# Patient Record
Sex: Female | Born: 2000 | Race: White | Hispanic: No | Marital: Single | Smoking: Never smoker
Health system: Southern US, Community
[De-identification: ages and names within clinical notes are randomized; demographics above are authoritative.]

## PROBLEM LIST (undated history)

## (undated) DIAGNOSIS — K5900 Constipation, unspecified: Secondary | ICD-10-CM

## (undated) DIAGNOSIS — S060XAA Concussion with loss of consciousness status unknown, initial encounter: Secondary | ICD-10-CM

## (undated) DIAGNOSIS — T753XXA Motion sickness, initial encounter: Secondary | ICD-10-CM

## (undated) DIAGNOSIS — J45909 Unspecified asthma, uncomplicated: Secondary | ICD-10-CM

## (undated) DIAGNOSIS — K5904 Chronic idiopathic constipation: Secondary | ICD-10-CM

## (undated) HISTORY — DX: Concussion with loss of consciousness status unknown, initial encounter: S06.0XAA

## (undated) HISTORY — DX: Motion sickness, initial encounter: T75.3XXA

## (undated) HISTORY — DX: Unspecified asthma, uncomplicated: J45.909

## (undated) HISTORY — DX: Constipation, unspecified: K59.00

## (undated) HISTORY — DX: Chronic idiopathic constipation: K59.04

---
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
## 2021-01-03 ENCOUNTER — Telehealth: Payer: Self-pay | Admitting: School

## 2021-01-03 NOTE — Telephone Encounter (Signed)
Patient s/p IUD removal on 12/31/20. States she has been having heavy vaginal bleeding and cramping the past couple of days, changing pad 2 times a day.  Informed she most likely got her period d/t sudden hormone changes.   Instructed to monitor and call if cramping becomes severe or bleeding excessive.  Recommended ibuprofen 600 mg every 6 hours.  Patient agreeable.

## 2021-01-18 ENCOUNTER — Ambulatory Visit: Payer: PRIVATE HEALTH INSURANCE | Admitting: Obstetrics and Gynecology

## 2021-01-18 ENCOUNTER — Other Ambulatory Visit: Payer: Self-pay

## 2021-01-18 ENCOUNTER — Encounter: Payer: Self-pay | Admitting: Obstetrics and Gynecology

## 2021-01-18 VITALS — BP 102/68 | Ht 66.0 in | Wt 112.0 lb

## 2021-01-18 DIAGNOSIS — N390 Urinary tract infection, site not specified: Secondary | ICD-10-CM

## 2021-01-18 LAB — POCT 7 URINALYSIS DIPSTICK
Blood,UA POCT: NEGATIVE
Exp date: 7312023
Glucose,UA POCT: NORMAL mg/dL
Ketones,UA POCT: NEGATIVE mg/dL
Lot #: 62072904
Nitrite,UA POCT: NEGATIVE
PH,UA POCT: 7 (ref 5–8)

## 2021-01-18 MED ORDER — NITROFURANTOIN MONOHYD MACRO 100 MG PO CAPS *I*
100.0000 mg | ORAL_CAPSULE | Freq: Two times a day (BID) | ORAL | 0 refills | Status: AC
Start: 2021-01-18 — End: 2021-01-23

## 2021-01-18 NOTE — Progress Notes (Signed)
HPI   CC: ?UTI    Context: Virginia Bowers is a premenopausal 20 y.o. female who today presents for ?UTI. She presents today with her mother.     Patient states that she will have UTI symptoms for a few days but than they will go away.     Patient had a Mirena IUD removed on 12/31/2020 due to pelvic pain and vaginal bleeding. She has been doing well since.     HISTORY  Patient's medications, allergies, past medical, surgical, social and family histories were reviewed and updated as appropriate. Pertinent history has been included in the HPI.    REVIEW OF SYSTEMS  See HPI    PHYSICAL EXAM  Vitals:    01/18/21 1304   BP: 102/68   Weight: 50.8 kg (112 lb)   Height: 1.676 m (5\' 6" )        General appearance: NAD, alert, cooperative      ASSESSMENT AND PLAN  1. UTI (urinary tract infection)             Virginia Bowers is a premenopausal 20 y.o. female seen today for UTI symptoms.   Urinalysis: trace protein, trace WBC. Indicative of a low grade UTI.   Prescription for 100 mg Macrobid sent to pharmacy. She is to take twice daily for 5 days.    All questions answered.   She will see me back for annual exam or sooner if she has any immediate concerns.      Attestations:  I 12, am scribing for and in the presence of Dr. Gaetana Michaelis. 01/18/21 1:05 PM  I, 01/20/21, MD, personally performed the services described in this documentation, as scribed by the scribe listed above in my presence, and it is accurate and complete.    Eustaquio Maize, MD    01/18/21 1:22 PM

## 2021-01-26 ENCOUNTER — Other Ambulatory Visit: Payer: Self-pay | Admitting: Family Medicine

## 2021-01-26 DIAGNOSIS — R1030 Lower abdominal pain, unspecified: Secondary | ICD-10-CM

## 2021-02-02 ENCOUNTER — Other Ambulatory Visit: Payer: Self-pay

## 2021-02-02 ENCOUNTER — Other Ambulatory Visit: Payer: Self-pay | Admitting: Family Medicine

## 2021-02-02 ENCOUNTER — Ambulatory Visit
Admission: RE | Admit: 2021-02-02 | Discharge: 2021-02-02 | Disposition: A | Discharge status: Home / Self Care | Payer: BLUE CROSS/BLUE SHIELD | Source: Ambulatory Visit | Attending: Family Medicine | Admitting: Family Medicine

## 2021-02-02 ENCOUNTER — Other Ambulatory Visit: Payer: Self-pay | Admitting: Gastroenterology

## 2021-02-02 DIAGNOSIS — R1030 Lower abdominal pain, unspecified: Secondary | ICD-10-CM

## 2021-09-16 ENCOUNTER — Ambulatory Visit (INDEPENDENT_AMBULATORY_CARE_PROVIDER_SITE_OTHER): Payer: BLUE CROSS/BLUE SHIELD | Admitting: Medical

## 2021-09-16 ENCOUNTER — Encounter: Payer: Self-pay | Admitting: Medical

## 2021-09-16 ENCOUNTER — Other Ambulatory Visit: Payer: Self-pay

## 2021-09-16 VITALS — BP 91/61 | HR 75 | Temp 98.5°F | Ht 66.14 in | Wt 123.0 lb

## 2021-09-16 DIAGNOSIS — R140 Abdominal distension (gaseous): Secondary | ICD-10-CM

## 2021-09-16 DIAGNOSIS — K5900 Constipation, unspecified: Secondary | ICD-10-CM

## 2021-09-16 NOTE — Progress Notes (Unsigned)
[facility] [address] [place], [place] [address] Phone: [phone] Fax: [phone]   Office Visit Note  Patient Name: Teresa Avila  Date of [id number]  Med Rec number [id number]  Date of Service: 09/18/2021  Sulfa antibiotics and Clindamycin phos-benzoyl perox  Chief Complaint  Patient presents with   GI Problem    Pain, bloating, constipation      HPI 21 YO college student presents requesting referral to GI.   Began to note cycles of bloating/constipation for several days followed by having a lot of stool in one day in May 2023. 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 Was staying with aunt (OB/GYN) who recommended enema. Has done these few times in last few months when symptoms severe, most recently about 1 week ago. Does not recall anything that changed or seemed to set off symptoms, i.e. diet, new medication, infection. Bloating causes lower abdomen to distend and be uncomfortable (patient states "looks like she is pregnant"), also associated with nausea. Thinks she has had 5-7 lb weight gain in last 6 months. Nausea and bloating sometimes associated with decreased appetite. No black or bloody stool. Periods generally regular. Not on birth control. No F/C. Some days, will have small BM but others will have none. Patient denies unusual amount of stress.  Has used used several over-the-counter meds periodically/not concurrently over last few months: Miralax, Docusate, Magnesium, probiotic and Simethicone. Currently using Docusate, Simethicone and probiotic each once daily.  Tried eliminating gluten and eggs, no change to symptoms. Does not routinely eat dairy. Patient notes she exercises often and makes of point of drinking water throughout day.  Hx of constipation in childhood, used prunes and Miralax regularly for some years, eventually resolved.  Saw PCP 2 weeks ago for this. Had labs done (CMP, TSH, celiac), all normal. Was recommended to see  GI but was returning to school. Home is [place]. Thinks her mom may have IBS. No family hx of Crohn's/Ulcerative Colitis or other significant GI diagnosis.  Current Medication:  No outpatient encounter medications on file as of 09/16/2021.   No facility-administered encounter medications on file as of 09/16/2021.    Medical History: History reviewed. No pertinent past medical history.   Vital Signs: BP 91/61   Pulse 75   Temp 98.5 F (36.9 C) (Tympanic)   Ht 5' 6.14" (1.68 m)   Wt 123 lb (55.8 kg)   SpO2 98%   BMI 19.77 kg/m    Review of Systems  Constitutional:  Positive for appetite change (occasionally decreased when bloated/constipated) and unexpected weight change (per patient 5-7 lb gain, no prior weights to compare). Negative for chills, fatigue and fever.  Gastrointestinal:  Positive for abdominal distention, abdominal pain, constipation and nausea. Negative for anal bleeding, blood in stool, rectal pain and vomiting.    Physical Exam Constitutional:      General: She is not in acute distress.    Appearance: She is not ill-appearing.  HENT:     Head: Normocephalic.     Mouth/Throat:     Mouth: Mucous membranes are moist.     Pharynx: Oropharynx is clear. No posterior oropharyngeal erythema.  Neck:     Thyroid: No thyroid mass, thyromegaly or thyroid tenderness.  Cardiovascular:     Rate and Rhythm: Normal rate and regular rhythm.     Heart sounds: No murmur heard.    No friction rub. No gallop.  Pulmonary:     Effort: Pulmonary effort is normal.  Breath sounds: Normal breath sounds. No wheezing, rhonchi or rales.  Abdominal:     General: Bowel sounds are normal. There is no distension.     Palpations: Abdomen is soft. There is no mass.     Tenderness: There is abdominal tenderness (mild, generalized to lower abdomen bilat). There is no guarding.  Musculoskeletal:     Cervical back: Normal range of motion and neck supple.  Lymphadenopathy:     Cervical: No  cervical adenopathy.  Neurological:     Mental Status: She is alert.  Psychiatric:        Mood and Affect: Mood normal.    Lab Results from Recent Visit (08/31/21) to PCP reviewed and summarized below: -Comp Metabolic Panel: all values within normal limits -TSH:  0.61 mIU/mL (normal) -Transglutaminase Ab, IgA: <1 (normal)  Assessment/Plan: 1. Constipation, unspecified constipation type 2. Abdominal bloating -Will make referral to local gastroenterologist as patient requests. Patient will verify with her insurance which local GI is covered under her plan and will send MyChart message to provider so I can make the referral.  -Symptoms do seem suggestive of irritable bowel syndrome, though other possibilities certainly exist.  -Patient advised to continue current meds: Docusate, probiotic and Simethicone; Also recommended adding IBGard supplement.  -Patient advised she can send my chart message to provider or schedule return visit to [facility] as needed while awaiting consultation with gastroenterologist for new/worsening symptoms.      General Counseling: Teresa Avila verbalizes understanding of the findings of todays visit and agrees with plan of treatment. I have discussed any further diagnostic evaluation that may be needed or ordered today. We also reviewed her medications today. she has been encouraged to call the office with any questions or concerns that should arise related to todays visit.   No orders of the defined types were placed in this encounter.   No orders of the defined types were placed in this encounter.   Time spent: 30 Minutes    Teresa Avila [employer] PA-C [facility] 09/18/2021 1:59 PM

## 2021-09-18 DIAGNOSIS — R140 Abdominal distension (gaseous): Secondary | ICD-10-CM

## 2021-09-18 DIAGNOSIS — K5900 Constipation, unspecified: Secondary | ICD-10-CM

## 2021-09-18 NOTE — Patient Instructions (Signed)
Check with your health insurance which local gastroenterologist [facility] [place], [place], [place]) is covered under your plan and send MyChart message to provider so I can make the referral.  Okay to continue current meds: Docusate, probiotic and Simethicone; Consider adding IBGard supplement.  Send my chart message to provider or schedule return visit to [facility] as needed while awaiting consultation with gastroenterologist for new/worsening symptoms.

## 2021-10-04 ENCOUNTER — Other Ambulatory Visit: Payer: Self-pay | Admitting: Medical

## 2021-10-04 DIAGNOSIS — R140 Abdominal distension (gaseous): Secondary | ICD-10-CM

## 2021-10-04 DIAGNOSIS — K5900 Constipation, unspecified: Secondary | ICD-10-CM

## 2021-10-04 NOTE — Progress Notes (Signed)
Additional referrals made to [facility] and [facility] per patient request.t

## 2021-10-05 ENCOUNTER — Encounter: Payer: Self-pay | Admitting: Gastroenterology

## 2021-10-05 DIAGNOSIS — R140 Abdominal distension (gaseous): Secondary | ICD-10-CM

## 2021-10-05 DIAGNOSIS — K5900 Constipation, unspecified: Secondary | ICD-10-CM

## 2021-10-06 NOTE — Telephone Encounter (Signed)
Called and spoke with patient. Has had only small amounts of stool with enemas done yesterday and day before. Used Phillips's Milk of Magnesia laxative last week without significant improvement. No laxative used in several days. Still using Simethicone. Has been nauseated.  Discussed that urgent care is an option but may not have much else to suggest. May go to emergency room if abdominal pain is severe.  Recommended using stimulant laxative (Dulcolax/Bisacodyl) either oral or suppository this evening. Advised I am willing to start daily medicine for chronic constipation/IBS while she awaits appointment with GI. She will discuss this with her parents. Asked patient to send another MyChart message tomorrow to let me know how things go with stimulant laxative and if she would like to try daily med for constipation.  She has appt with [facility] scheduled for 11/02/21. She hasn't called [facility] or [facility] yet to follow up on referrals. She is checking to see if [facility] is covered by her insurance.

## 2021-10-07 DIAGNOSIS — K5900 Constipation, unspecified: Secondary | ICD-10-CM

## 2021-10-07 MED ORDER — LINACLOTIDE 145 MCG PO CAPS
145.0000 ug | ORAL_CAPSULE | Freq: Every day | ORAL | 0 refills | Status: DC
Start: 2021-10-07 — End: 2021-11-08

## 2021-10-13 ENCOUNTER — Encounter: Payer: Self-pay | Admitting: Medical

## 2021-10-13 ENCOUNTER — Ambulatory Visit (INDEPENDENT_AMBULATORY_CARE_PROVIDER_SITE_OTHER): Payer: BLUE CROSS/BLUE SHIELD | Admitting: Medical

## 2021-10-13 ENCOUNTER — Other Ambulatory Visit: Payer: Self-pay

## 2021-10-13 VITALS — BP 110/81 | HR 59 | Temp 97.5°F | Wt 121.0 lb

## 2021-10-13 DIAGNOSIS — K5900 Constipation, unspecified: Secondary | ICD-10-CM

## 2021-10-13 DIAGNOSIS — R140 Abdominal distension (gaseous): Secondary | ICD-10-CM

## 2021-10-13 NOTE — Progress Notes (Signed)
[facility] [address] [place], [place] [address] Phone: [phone] Fax: [phone]   Office Visit Note  Patient Name: Teresa Avila  Date of [id number]  Med Rec number [id number]  Date of Service: 10/13/2021  Allergies: Sulfa antibiotics and Clindamycin phos-benzoyl perox  Chief Complaint  Patient presents with   GI Problem     HPI See previous note from 09/16/21 and recent MyChart messages. Pt did give herself another enema 2d ago, had been 6d since prior enema and BM. Started taking Linzess 2d ago. Had little watery diarrhea before taking enema, then loose/watery and fragments of formed stool after enema. No bloody or black stool. Some temporary relief of bloating and nausea after enema. No fever/chills. Still taking Gas-X. No probiotic in last 1-2 weeks, ran out. Still taking stool softener. 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   Thinks menses coming soon. Had couple days last week when she couldn't eat much due to nausea and fear of eating more when bowels not moving. Eating more normally last couple days. Still having some nausea, timing seems random, not necessarily after eating. Has hx of acid reflux, experiencing this recently as well. Taking Tums.  Has appointment scheduled 11/02/21 with [facility]. Still plans to call other GI offices to check for availability of earlier appointment.    Current Medication:  Outpatient Encounter Medications as of 10/13/2021  Medication Sig   linaclotide (LINZESS) 145 MCG CAPS capsule Take 1 capsule (145 mcg total) by mouth daily before breakfast.   No facility-administered encounter medications on file as of 10/13/2021.      Medical History: History reviewed. No pertinent past medical history.   Vital Signs: BP 110/81   Pulse (!) 59   Temp (!) 97.5 F (36.4 C) (Tympanic)   Wt 121 lb (54.9 kg)   SpO2 100%   BMI 19.45  kg/m    Review of Systems  Constitutional:  Negative for chills, fatigue and fever.  Gastrointestinal:  Positive for abdominal distention, abdominal pain, constipation, diarrhea (after enema) and nausea. Negative for blood in stool and vomiting.    Physical Exam Vitals reviewed.  Constitutional:      General: She is not in acute distress.    Appearance: She is not ill-appearing.  Cardiovascular:     Rate and Rhythm: Normal rate and regular rhythm.     Heart sounds: No murmur heard.    No friction rub. No gallop.  Pulmonary:     Effort: Pulmonary effort is normal.     Breath sounds: Normal breath sounds. No wheezing, rhonchi or rales.  Abdominal:     General: Abdomen is flat. Bowel sounds are normal. There is distension (slight).     Palpations: Abdomen is soft. There is no hepatomegaly, splenomegaly or mass.     Tenderness: There is generalized abdominal tenderness (mild). There is no guarding or rebound.  Musculoskeletal:     Cervical back: Neck supple. No rigidity.  Neurological:     Mental Status: She is alert.     Assessment/Plan: 1. Constipation, unspecified constipation type 2. Abdominal bloating Stable overall. Patient has upcoming appointment scheduled with GI for further evaluation.  -Continue Linzess until able to see gastroenterology. -Encouraged to resume probiotic. Consider adding IBgard. -May continue Gas-X as needed. Continue daily stool softener. -If no bowel movement in several days, okay to take Miralax once a day until bowel movement occurs or use  Dulcolax laxative. -May try over-the-counter Pepcid AC if needed for acid reflux. -Avoid spicy, fried or acidic foods. -Continue to drink plenty of water. -Keep scheduled appointment with gastroenterology. -Send MyChart message to provider or schedule return visit as needed in meantime for new/worsening symptoms (i.e. increased abdominal pain, fever, blood in stool).   General Counseling: [name] verbalizes  understanding of the findings of todays visit and agrees with plan of treatment. I have discussed any further diagnostic evaluation that may be needed or ordered today. We also reviewed her medications today. she has been encouraged to call the office with any questions or concerns that should arise related to todays visit.   No orders of the defined types were placed in this encounter.   No orders of the defined types were placed in this encounter.   Time spent:20 [place] PA-C [place] 10/13/2021 9:21 AM

## 2021-10-15 ENCOUNTER — Encounter: Payer: Self-pay | Admitting: Medical

## 2021-10-15 NOTE — Patient Instructions (Addendum)
-  Continue Linzess until able to see gastroenterology. -Resume taking probiotic. Consider adding Ibgard supplement. -May continue Gas-X as needed. Continue daily stool softener. -If no bowel movement in several days, okay to take Miralax once a day until bowel movement occurs or use Dulcolax laxative. -May try over-the-counter Pepcid AC if needed for acid reflux. -Avoid spicy, fried or acidic foods. -Continue to drink plenty of water.  -Keep scheduled appointment with gastroenterology. -Send MyChart message to provider or schedule return visit as needed in meantime for new/worsening symptoms (i.e. increased abdominal pain, fever, blood in stool).

## 2021-11-02 ENCOUNTER — Ambulatory Visit: Payer: BLUE CROSS/BLUE SHIELD | Admitting: Gastroenterology

## 2021-11-08 ENCOUNTER — Other Ambulatory Visit: Payer: Self-pay | Admitting: Medical

## 2021-11-08 DIAGNOSIS — K581 Irritable bowel syndrome with constipation: Secondary | ICD-10-CM

## 2021-11-08 MED ORDER — LINACLOTIDE 145 MCG PO CAPS: 145.0000 ug | ORAL_CAPSULE | Freq: Every day | ORAL | 2 refills | Status: DC

## 2021-12-09 DIAGNOSIS — K581 Irritable bowel syndrome with constipation: Secondary | ICD-10-CM

## 2021-12-09 NOTE — Telephone Encounter (Signed)
Called and spoke to patient. Advised she should have 2 refills on current prescription at [facility] in [place]. She states she will call pharmacy to request refill. If she is not able to pick up refill before she leaves town today, she will send me another message in MyChart so I can send a prescription to a pharmacy near her home. For future refills, recommended she call GI to request prescription since GI is now managing condition and is most up-to-date on current symptoms.

## 2022-01-11 ENCOUNTER — Other Ambulatory Visit
Admission: RE | Admit: 2022-01-11 | Discharge: 2022-01-11 | Disposition: A | Discharge status: Home / Self Care | Payer: PRIVATE HEALTH INSURANCE | Source: Ambulatory Visit | Attending: Pediatrics | Admitting: Pediatrics

## 2022-01-11 DIAGNOSIS — Z113 Encounter for screening for infections with a predominantly sexual mode of transmission: Secondary | ICD-10-CM | POA: Insufficient documentation

## 2022-01-12 LAB — N. GONORRHOEAE NAAT (PCR): N. gonorrhoeae NAAT (PCR): NEGATIVE

## 2022-01-12 LAB — CHLAMYDIA NAAT (PCR): Chlamydia NAAT (PCR): POSITIVE — AB

## 2022-03-08 ENCOUNTER — Other Ambulatory Visit: Payer: Self-pay | Admitting: Medical

## 2022-03-08 DIAGNOSIS — K581 Irritable bowel syndrome with constipation: Secondary | ICD-10-CM

## 2022-06-02 ENCOUNTER — Encounter: Payer: Self-pay | Admitting: Medical

## 2022-06-02 ENCOUNTER — Other Ambulatory Visit: Payer: Self-pay

## 2022-06-02 ENCOUNTER — Ambulatory Visit (INDEPENDENT_AMBULATORY_CARE_PROVIDER_SITE_OTHER): Payer: BLUE CROSS/BLUE SHIELD | Admitting: Medical

## 2022-06-02 VITALS — BP 100/71 | HR 85 | Temp 98.1°F | Wt 115.0 lb

## 2022-06-02 DIAGNOSIS — L239 Allergic contact dermatitis, unspecified cause: Secondary | ICD-10-CM

## 2022-06-02 DIAGNOSIS — B360 Pityriasis versicolor: Secondary | ICD-10-CM

## 2022-06-02 MED ORDER — TRIAMCINOLONE ACETONIDE 0.1 % EX CREA: 1.0000 | TOPICAL_CREAM | Freq: Two times a day (BID) | CUTANEOUS | 0 refills | Status: DC

## 2022-06-02 NOTE — Progress Notes (Unsigned)
[facility] 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  Initially noted several flat circular lesions to arms, legs, back, neck and scalp few weeks ago. Not itchy or painful. Not getting larger. 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 Denies hx of dandruff but has had this recently. Used some tanning oil once over spring break. Lesions little dry/scaly. No other new skin care routine/products.   Developed plaque to right buttock/lateral thigh about 6 days ago. Was in [place] for weekend at the time, sitting on beach, no significant contact with plants. Initially thought it may be bug bites, began as few papules. Woke up 3 nights ago with a second linear plaque nearby. Both are itchy/red. Applied OTC hydrocortisone cream last night, little helpful.   Patient has scheduled appointment with dermatologist at home for late May.   Current Medication:  Outpatient Encounter Medications as of 06/02/2022  Medication Sig   linaclotide (LINZESS) 145 MCG CAPS capsule Take 1 capsule (145 mcg total) by mouth daily before breakfast.   No facility-administered encounter medications on file as of 06/02/2022.      Medical History: Past Medical History:  Diagnosis Date   Chronic idiopathic constipation      Vital Signs: BP 100/71   Pulse 85   Temp 98.1 F (36.7 C) (Tympanic)   Wt 115 lb (52.2 kg)   SpO2 98%   BMI 18.48 kg/m    Review of Systems  Constitutional:  Negative for chills and fever.  Skin:  Positive for rash.    Physical Exam Vitals reviewed.  Constitutional:      General: She is not in  acute distress.    Appearance: She is not ill-appearing.  Skin:    Comments: Two moderately erythematous plaques to right buttock/lateral thigh, bumpy texture, slightly warm.   Many small, discrete, round, well-demarcated hypopigmented macules to back, abdomen and extremities. Several erythematous plaques to scalp, mild scale/dandruff.  Neurological:     Mental Status: She is alert.     Assessment/Plan: 1. Allergic contact dermatitis, unspecified trigger Plaques to right buttock/thigh appear consistent with contact dermatitis. Cause/source of reaction unclear. Will try Triamcinolone cream twice daily. Also discussed OTC Calamine lotion. Will try to avoid oral steroid given what appears to be fungal infection elsewhere on skin.  - triamcinolone cream (KENALOG) 0.1 %; Apply 1 Application topically 2 (two) times daily. Do not use longer than 2 weeks.  Dispense: 30 g; Refill: 0  2. Tinea versicolor Versus seborrheic dermatitis? In either case, treatment is similar. Recommended OTC Nizoral (Ketoconazole) shampoo to scalp and affected areas of skin 3x/week for next 2 weeks, then 2x/week for 2 weeks, then as needed. Also discussed OTC Lotrimin cream twice daily for skin lesions. Discussed that it may take several weeks for skin pigmentation to return to normal in affected areas. Keep appointment with dermatologist if lesions not responding to treatment over next few weeks.   General Counseling: Teresa Avila verbalizes understanding of the findings of todays visit and agrees with plan of treatment. she has been encouraged to call the office with any questions or concerns that should arise related to todays  visit.   Meds ordered this encounter  Medications   triamcinolone cream (KENALOG) 0.1 %    Sig: Apply 1 Application topically 2 (two) times daily. Do not use longer than 2 weeks.    Dispense:  30 g    Refill:  0    Order Specific Question:   Supervising Provider    Answer:   Noralee Stain [[id number]]     Time spent:20 Minutes    Jonathon Resides PA-C [facility] 06/02/2022 1:17 PM

## 2022-06-03 ENCOUNTER — Encounter: Payer: Self-pay | Admitting: Medical

## 2022-06-03 NOTE — Patient Instructions (Addendum)
-  Apply Triamcinolone cream to the itchy areas on your right leg/buttock twice daily as needed for itching/inflammation. Do not use longer than 2 weeks and not longer than needed to control itching. -Try OTC Calamine lotion for itching. -Avoid scratching itchy areas.  -Use Nizoral (Ketoconazole) shampoo on your scalp and the light patches on your skin. Leave on after applying for at least 5 minutes before rinsing. Use about 3 times a week for the next 2 weeks, then decrease to 2 times per week for 2 weeks, then use as needed. -You can also apply Lotrimin (Clotrimazole) cream to the light-colored areas on your skin twice daily.  -Keep appointment with dermatologist if lesions not responding to treatment over next few weeks.

## 2022-06-08 ENCOUNTER — Encounter: Payer: Self-pay | Admitting: Medical

## 2022-06-08 ENCOUNTER — Ambulatory Visit: Payer: BLUE CROSS/BLUE SHIELD | Admitting: Medical

## 2022-06-08 ENCOUNTER — Ambulatory Visit (INDEPENDENT_AMBULATORY_CARE_PROVIDER_SITE_OTHER): Payer: BLUE CROSS/BLUE SHIELD | Admitting: Medical

## 2022-06-08 ENCOUNTER — Other Ambulatory Visit: Payer: Self-pay

## 2022-06-08 VITALS — BP 103/68 | HR 75 | Temp 97.4°F

## 2022-06-08 DIAGNOSIS — R21 Rash and other nonspecific skin eruption: Secondary | ICD-10-CM

## 2022-06-08 DIAGNOSIS — L299 Pruritus, unspecified: Secondary | ICD-10-CM

## 2022-06-08 NOTE — Progress Notes (Signed)
[facility] 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  Noted itching beginning 2 nights ago to axillae and chest. Also had rash to same areas, raised/bumpy/red. Took Benadryl that night. By next day, rash/itching had spread from ears to ankles. Itching has made it difficult to sleep at times despite taking Benadryl.   Was sick last week with what seemed like a cold. Feeling better now but has had continued wet cough, but has not been expectorating mucus. Nose had been congested but better in last 24 hours, mucus was little yellow. No fever or chills.  Recent plaques to lateral right thigh/buttock getting better (see prior note). Seemingly helped with topical steroid and Calamine lotion.  Used Nizoral shampoo once so far for plaques on scalp.  Has upcoming appointment with dermatology at home.    Current Medication:  Outpatient Encounter Medications as of 06/08/2022  Medication Sig   linaclotide (LINZESS) 145 MCG CAPS capsule Take 1 capsule (145 mcg total) by mouth daily before breakfast.   triamcinolone cream (KENALOG) 0.1 % Apply 1 Application topically 2 (two) times daily. Do not use longer than 2 weeks.   No facility-administered encounter medications on file as of 06/08/2022.      Medical History: Past Medical History:  Diagnosis Date   Chronic idiopathic constipation      Vital Signs: BP 103/68   Pulse 75   Temp (!) 97.4 F (36.3 C) (Tympanic)   SpO2 98%    Review of Systems See HPI  Physical Exam Vitals reviewed.  Constitutional:      General: She is not in acute distress.    Appearance: She is not ill-appearing.   HENT:     Head: Normocephalic.     Nose: Mucosal edema (slight) present. No congestion or rhinorrhea.     Mouth/Throat:     Mouth: Mucous membranes are moist. No oral lesions.     Pharynx: Uvula midline. No pharyngeal swelling, posterior oropharyngeal erythema or uvula swelling.     Tonsils: No tonsillar exudate. 0 on the right. 0 on the left.     Comments: Small mucoid PND Cardiovascular:     Rate and Rhythm: Normal rate and regular rhythm.     Heart sounds: No murmur heard.    No friction rub. No gallop.  Pulmonary:     Effort: Pulmonary effort is normal.     Breath sounds: Normal breath sounds. No wheezing, rhonchi or rales.     Comments: Intermittent croupy cough Musculoskeletal:     Cervical back: Neck supple. No rigidity.  Lymphadenopathy:     Cervical: Cervical adenopathy (small anterior nodes, nontender) present.  Skin:    General: Skin is warm.     Comments: Plaques to lateral right thigh/buttock persist but are less erythematous. New linear plaque with bumpy texture to lower/lateral thigh.   Fine papules, some with mild erythema to legs/trunk/neck.  Neurological:     Mental Status: She is alert.     Assessment/Plan: 1. Rash and nonspecific skin eruption 2. Pruritus New pruritus and body rash may or may not be related to previously noted contact dermatitis. Dr. Andrey Spearman in to consult at [facility]  request. Contact dermatitis does appear to be resolving with topical steroid/Calamine. Will check some basic labs and EBV panel to look for evidence of infection, allergy or other possible cause for rash/itching. Recommended trial of high-dose antihistamine, 1-2 OTC Claritin/Loratadine twice daily. May continue Benadryl at night if needed for itching/sleep. Encouraged to drink plenty of water. Will follow up via MyChart with lab results when available. Would consider oral steroid if antihistamines insufficient to relieve itching.  - CBC with Differential/Platelet - Comprehensive  metabolic panel - EPSTEIN-BARR VIRUS (EBV) Antibody Profile    General Counseling: Shakeena verbalizes understanding of the findings of todays visit and agrees with plan of treatment. she has been encouraged to call the office with any questions or concerns that should arise related to todays visit.  Time spent:20 Minutes    Jonathon Resides PA-C [facility] 06/08/2022 10:26 AM

## 2022-06-09 LAB — COMPREHENSIVE METABOLIC PANEL
ALT: 28 IU/L (ref 0–32)
AST: 27 IU/L (ref 0–40)
Albumin/Globulin Ratio: 1.5 (ref 1.2–2.2)
Albumin: 4.7 g/dL (ref 4.0–5.0)
Alkaline Phosphatase: 72 IU/L (ref 44–121)
BUN/Creatinine Ratio: 16 (ref 9–23)
BUN: 12 mg/dL (ref 6–20)
Bilirubin Total: 0.3 mg/dL (ref 0.0–1.2)
CO2: 26 mmol/L (ref 20–29)
Calcium: 10.5 mg/dL — ABNORMAL HIGH (ref 8.7–10.2)
Chloride: 99 mmol/L (ref 96–106)
Creatinine, Ser: 0.75 mg/dL (ref 0.57–1.00)
Globulin, Total: 3.2 g/dL (ref 1.5–4.5)
Glucose: 77 mg/dL (ref 70–99)
Potassium: 4.4 mmol/L (ref 3.5–5.2)
Sodium: 140 mmol/L (ref 134–144)
Total Protein: 7.9 g/dL (ref 6.0–8.5)
eGFR: 116 mL/min/{1.73_m2} (ref >59)

## 2022-06-09 LAB — CBC WITH DIFFERENTIAL/PLATELET
Basophils Absolute: 0 10*3/uL (ref 0.0–0.2)
Basos: 1 %
EOS (ABSOLUTE): 0.4 10*3/uL (ref 0.0–0.4)
Eos: 5 %
Hematocrit: 43.3 % (ref 34.0–46.6)
Hemoglobin: 14.6 g/dL (ref 11.1–15.9)
Immature Grans (Abs): 0 10*3/uL (ref 0.0–0.1)
Immature Granulocytes: 0 %
Lymphocytes Absolute: 1.8 10*3/uL (ref 0.7–3.1)
Lymphs: 23 %
MCH: 31.6 pg (ref 26.6–33.0)
MCHC: 33.7 g/dL (ref 31.5–35.7)
MCV: 94 fL (ref 79–97)
Monocytes Absolute: 0.5 10*3/uL (ref 0.1–0.9)
Monocytes: 7 %
Neutrophils Absolute: 5.1 10*3/uL (ref 1.4–7.0)
Neutrophils: 64 %
Platelets: 326 10*3/uL (ref 150–450)
RBC: 4.62 x10E6/uL (ref 3.77–5.28)
RDW: 12.5 % (ref 11.7–15.4)
WBC: 7.9 10*3/uL (ref 3.4–10.8)

## 2022-06-09 LAB — EPSTEIN-BARR VIRUS (EBV) ANTIBODY PROFILE
EBV NA IgG: 174 U/mL — ABNORMAL HIGH (ref 0.0–17.9)
EBV VCA IgG: 222 U/mL — ABNORMAL HIGH (ref 0.0–17.9)
EBV VCA IgM: <36 U/mL (ref 0.0–35.9)

## 2022-06-10 DIAGNOSIS — K5904 Chronic idiopathic constipation: Secondary | ICD-10-CM

## 2022-06-10 DIAGNOSIS — K581 Irritable bowel syndrome with constipation: Secondary | ICD-10-CM

## 2022-06-12 MED ORDER — LINACLOTIDE 145 MCG PO CAPS
145.0000 ug | ORAL_CAPSULE | Freq: Every day | ORAL | 2 refills | Status: DC
Start: 2022-06-12 — End: 2022-09-08

## 2022-06-19 NOTE — Patient Instructions (Signed)
-  Continue Triamcinolone cream and Calamine lotion as needed. -Take 1 or 2 OTC Claritin/Loratadine twice daily to help relieve itching. You may continue taking Benadryl at night it needed. -Drink plenty of water. -You will receive a MyChart message notifying you of your lab results when they are available.  -Send MyChart message to provider or schedule return visit in meantime as needed for new/worsening symptoms.

## 2022-08-19 IMAGING — US US PELVIS COMPLETE
1 series · 15 of 25 positions shown · non-contrast
Comparison: None available.

CLINICAL DATA: Initial evaluation for midline lower abdominal pain
for 1 week.

EXAM:
TRANSABDOMINAL ULTRASOUND OF PELVIS
TECHNIQUE: Transabdominal ultrasound examination of the pelvis was performed
including evaluation of the uterus, ovaries, adnexal regions, and
pelvic cul-de-sac.

[Series 1: us pelvis complete · 15 of 90 slices shown]
[im 1/90]
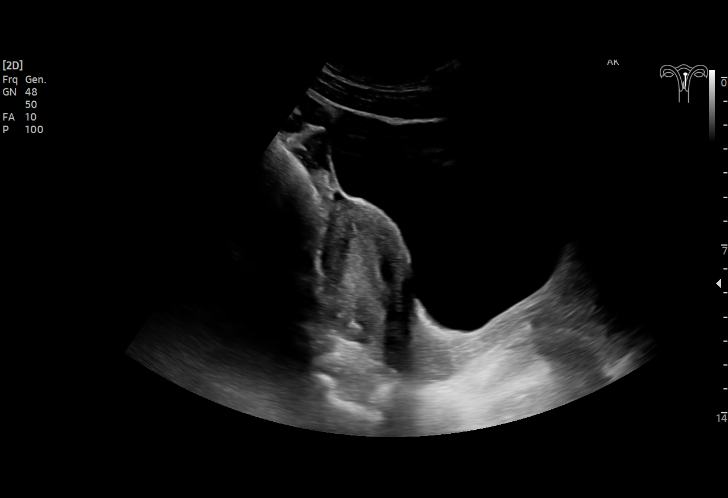
[im 8/90]
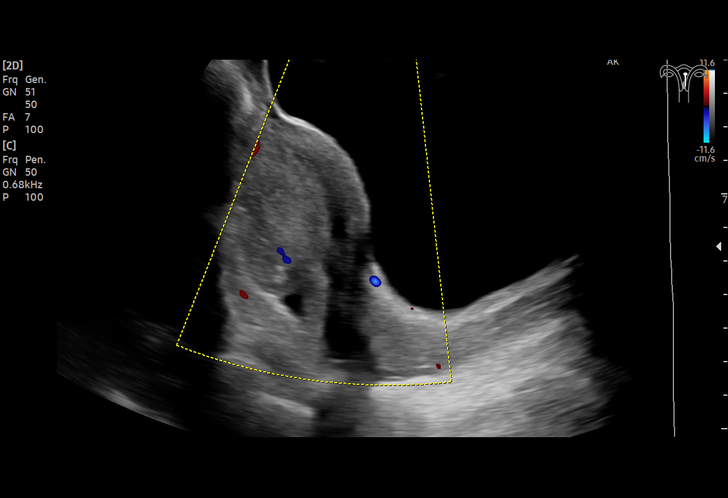
[im 15/90]
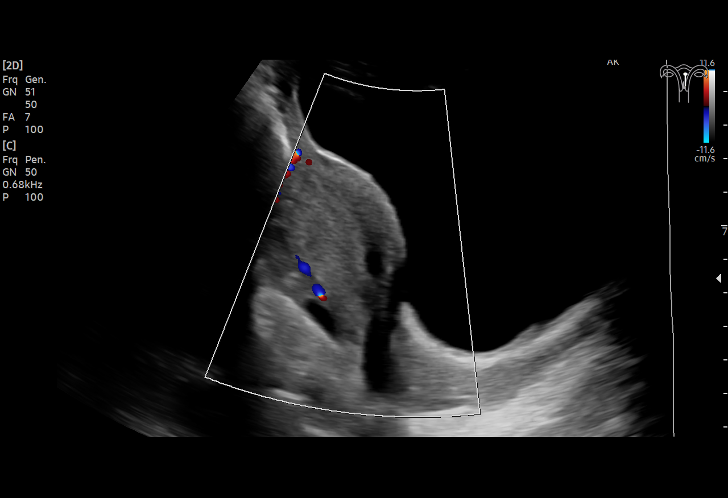
[im 19/90]
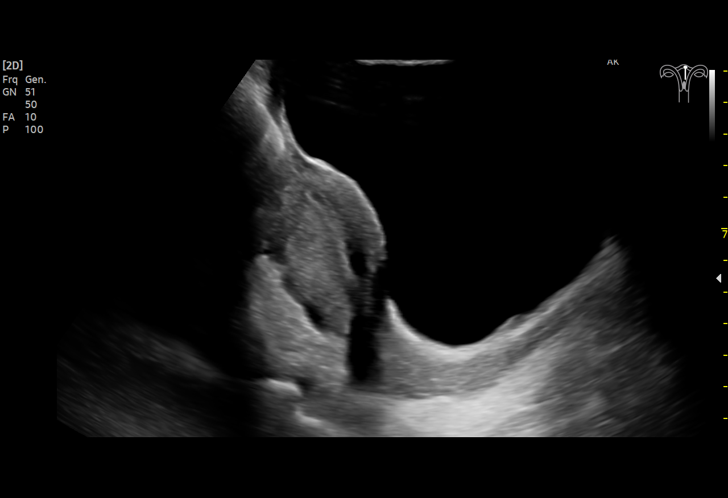
[im 26/90]
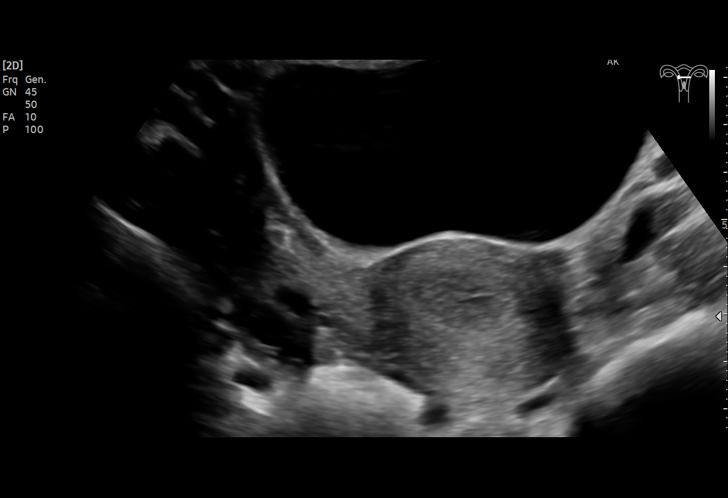
[im 34/90]
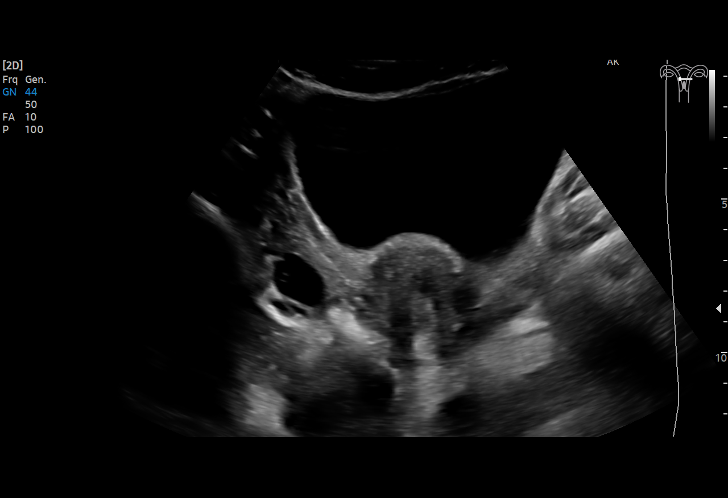
[im 38/90]
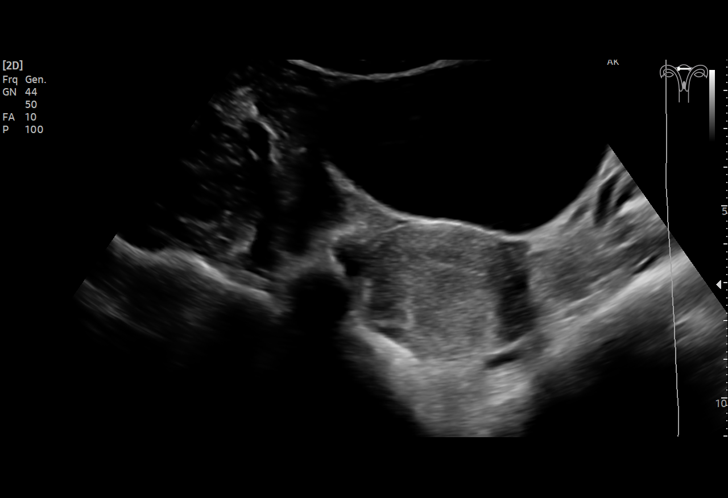
[im 45/90]
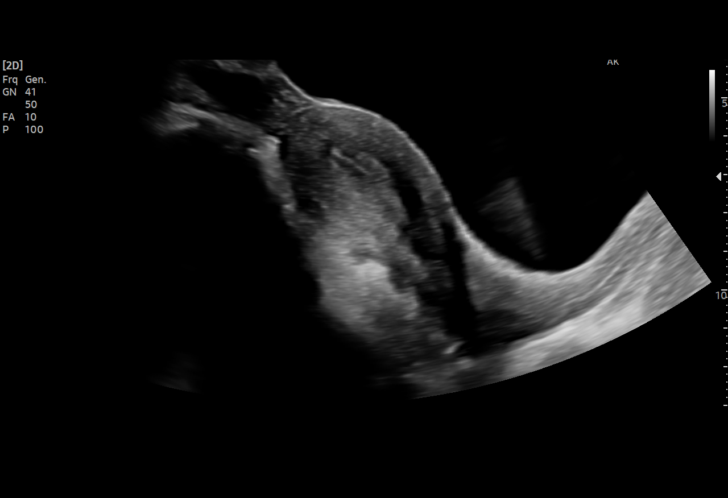
[im 52/90]
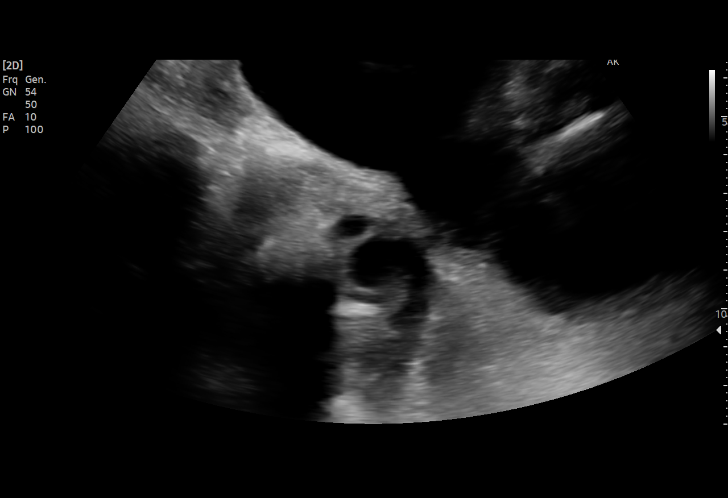
[im 56/90]
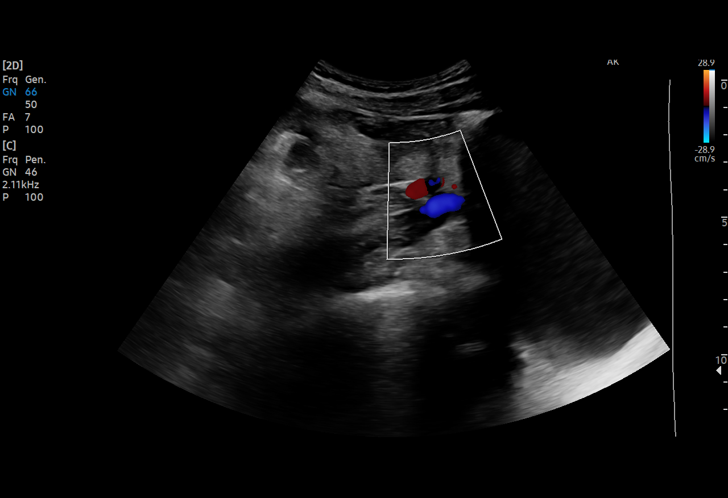
[im 64/90]
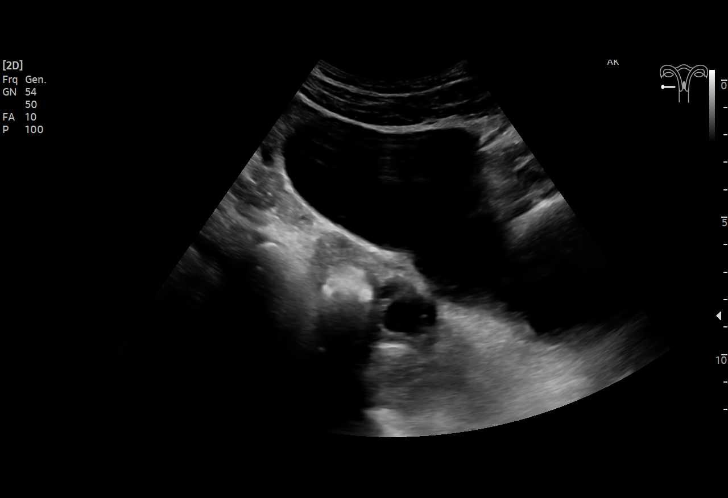
[im 71/90]
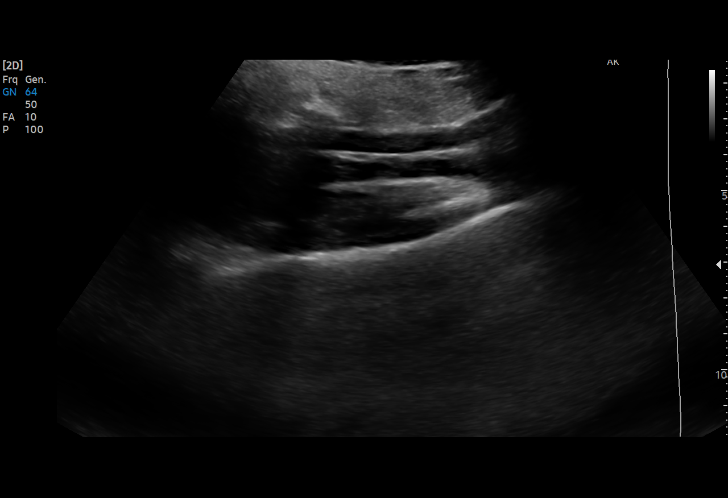
[im 75/90]
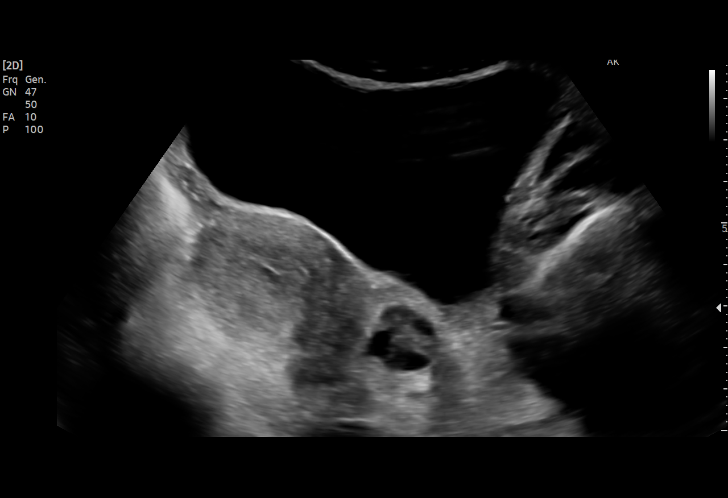
[im 82/90]
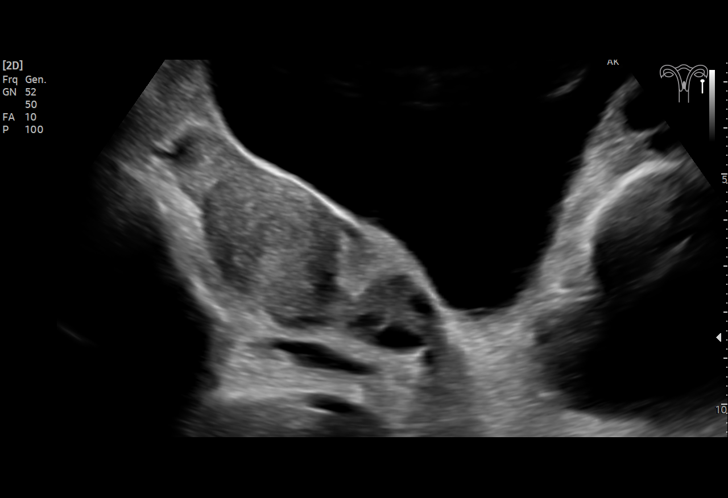
[im 90/90]
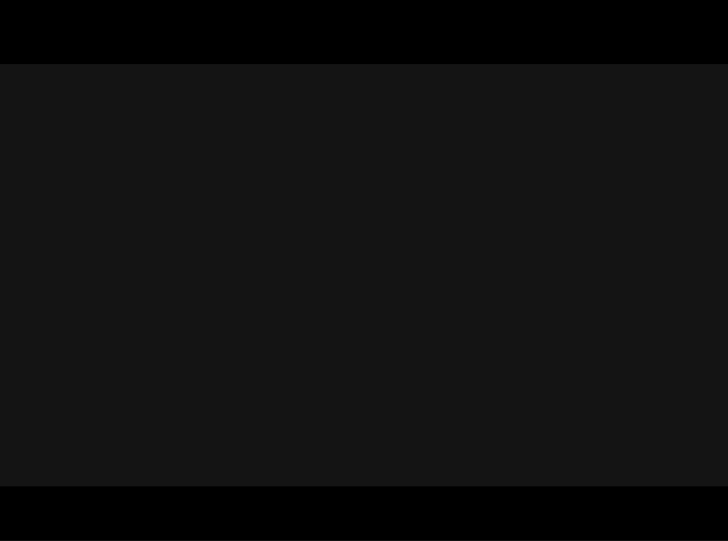

[15 of 25 positions shown; findings below may reference images not displayed]

FINDINGS: Uterus

Measurements: 8.6 x 3.3 x 3.9 cm = volume: 57.0 mL. Uterus is
anteverted. No discrete fibroid or other myometrial abnormality.

Endometrium

Thickness: 8 mm.  No focal abnormality visualized.

Right ovary

Measurements: 3.2 x 2.7 x 2.4 cm = volume: 10.5 mL. 2.1 x 2.0 x
cm dominant follicle. No other adnexal mass.

Left ovary

Measurements: 2.4 x 1.7 x 2.5 cm = volume: 5.3 mL. Normal
appearance/no adnexal mass.

Other findings:  No abnormal free fluid.
IMPRESSION: 1. 2.1 cm dominant right ovarian follicle.
2. Otherwise unremarkable and normal pelvic ultrasound. No other
findings to explain patient's symptoms.

## 2022-09-07 ENCOUNTER — Other Ambulatory Visit: Payer: Self-pay | Admitting: Medical

## 2022-09-07 DIAGNOSIS — K581 Irritable bowel syndrome with constipation: Secondary | ICD-10-CM

## 2022-10-10 ENCOUNTER — Encounter: Payer: Self-pay | Admitting: Medical

## 2022-10-10 ENCOUNTER — Ambulatory Visit (INDEPENDENT_AMBULATORY_CARE_PROVIDER_SITE_OTHER): Payer: BLUE CROSS/BLUE SHIELD | Admitting: Medical

## 2022-10-10 ENCOUNTER — Other Ambulatory Visit: Payer: Self-pay

## 2022-10-10 VITALS — BP 98/63 | HR 68 | Temp 97.5°F | Ht 65.35 in | Wt 126.0 lb

## 2022-10-10 DIAGNOSIS — J0300 Acute streptococcal tonsillitis, unspecified: Secondary | ICD-10-CM | POA: Diagnosis not present

## 2022-10-10 LAB — POCT RAPID STREP A (OFFICE): Rapid Strep A Screen: POSITIVE — AB

## 2022-10-10 MED ORDER — PENICILLIN V POTASSIUM 500 MG PO TABS
500.0000 mg | ORAL_TABLET | Freq: Two times a day (BID) | ORAL | 0 refills | Status: AC
Start: 2022-10-10 — End: 2022-10-20

## 2022-10-10 NOTE — Patient Instructions (Signed)
-  Finish all antibiotics as prescribed, even when you are feeling better. Take antibiotic with food.   -Take an over-the-counter pain reliever/anti-inflammatory (such as ibuprofen) to help relieve pain or fever.   -Rest and drink plenty of water.  Drinking warm or cold liquids (such as tea, soup or smoothies) and eating soft foods (such as oatmeal) may be more comfortable until your throat pain improves.   -Do not share cups/water bottles/ utensils with others.  Wash your hands or use hand sanitizer often, especially before/after eating and after coughing into your hand or blowing your nose.   -Send MyChart message to provider or schedule return appointment as needed for new/worsening symptoms (such as increased throat pain or difficulty swallowing) or if your symptoms are not improving after 2-3 days on the antibiotic.

## 2022-10-10 NOTE — Progress Notes (Signed)
[facility] [address] [place], [place] [address] Phone: [phone] Fax: [phone]   Office Visit Note  Patient Name: Teresa Avila  Date of [id number]  Med Rec number [id number]  Date of Service: 10/10/2022  Allergies: Sulfa antibiotics and Clindamycin phos-benzoyl perox  Chief Complaint  Patient presents with   sick     HPI 22 y.o. college student presents with respiratory symptoms.  Recalls not feeling well 15 days ago. Felt much worse a couple days later - had fever (up to 103 for about 24 hours), nauseated, myalgias, chills, HA, runny/congested nose and cough. Gradually felt better over next week. Also had ST initially but tonsils were not inflamed. Roommate had COVID around this time but patient tested negative.   Tonsils swollen and have white patches now, began about 3 days ago. Very fatigued yesterday, slept through both classes. Lymph nodes also swollen and tender. Feels mucus in throat that is hard to clear. No recent fever, chills or sweats. Mild nasal congestion. No current nausea, vomiting or diarrhea. Occasional HA.  Thinks she had mono before about 2 years ago, evident on serology. No known strep exposures.  Taking Xyzal and Linzess. Nyquil and Ibuprofen in last 24 hours, none this AM.    Current Medication:  Outpatient Encounter Medications as of 10/10/2022  Medication Sig   LINZESS 145 MCG CAPS capsule TAKE 1 CAPSULE BY MOUTH DAILY BEFORE BREAKFAST.   triamcinolone cream (KENALOG) 0.1 % Apply 1 Application topically 2 (two) times daily. Do not use longer than 2 weeks.   No facility-administered encounter medications on file as of 10/10/2022.      Medical History: Past Medical History:  Diagnosis Date   Chronic idiopathic constipation      Vital Signs: BP 98/63   Pulse 68   Temp (!) 97.5 F (36.4 C) (Tympanic)   Ht 5' 5.35" (1.66 m)   Wt 126 lb (57.2 kg)   SpO2 98%   BMI 20.74 kg/m    Review of Systems See  HPI  Physical Exam Vitals reviewed.  Constitutional:      General: She is not in acute distress.    Appearance: She is not ill-appearing.     Comments: Tired appearing  HENT:     Head: Normocephalic.     Right Ear: Ear canal and external ear normal.     Left Ear: Ear canal and external ear normal.     Ears:     Comments: TMs slightly dull    Nose: Mucosal edema present. No congestion or rhinorrhea.     Mouth/Throat:     Mouth: Mucous membranes are moist. No oral lesions.     Pharynx: Uvula midline. Posterior oropharyngeal erythema (mild-moderate) present. No pharyngeal swelling or uvula swelling.     Tonsils: Tonsillar exudate (small white/beige) present. 2+ on the right. 2+ on the left.     Comments: Moderrate erythema to tonsils Cardiovascular:     Rate and Rhythm: Normal rate and regular rhythm.     Heart sounds: No murmur heard.    No friction rub. No gallop.  Pulmonary:     Effort: Pulmonary effort is normal.     Breath sounds: Normal breath sounds. No wheezing, rhonchi or rales.  Musculoskeletal:     Cervical back: Neck supple. No rigidity.  Lymphadenopathy:     Cervical: Cervical adenopathy (1-2+ anterior nodes, tender) present.  Neurological:     Mental Status: She is alert.     Results for orders placed  or performed in visit on 10/10/22 (from the past 24 hour(s))  POCT rapid strep A     Status: Abnormal   Collection Time: 10/10/22  9:01 AM  Result Value Ref Range   Rapid Strep A Screen Positive (A) Negative     Assessment/Plan: 1. Streptococcal tonsillitis POC Strep A antigen test positive. Exam findings consistent with this.  - penicillin v potassium (VEETID) 500 MG tablet; Take 1 tablet (500 mg total) by mouth 2 (two) times daily for 10 days.  Dispense: 20 tablet; Refill: 0 - POCT rapid strep A   Patient Instructions  -Finish all antibiotics as prescribed, even when you are feeling better. Take antibiotic with food.   -Take an over-the-counter pain  reliever/anti-inflammatory (such as ibuprofen) to help relieve pain or fever.   -Rest and drink plenty of water.  Drinking warm or cold liquids (such as tea, soup or smoothies) and eating soft foods (such as oatmeal) may be more comfortable until your throat pain improves.   -Do not share cups/water bottles/ utensils with others.  Wash your hands or use hand sanitizer often, especially before/after eating and after coughing into your hand or blowing your nose.   -Send MyChart message to provider or schedule return appointment as needed for new/worsening symptoms (such as increased throat pain or difficulty swallowing) or if your symptoms are not improving after 2-3 days on the antibiotic.      General Counseling: Bekka verbalizes understanding of the findings of todays visit and agrees with plan of treatment. she has been encouraged to call the office with any questions or concerns that should arise related to todays visit.   Time spent:20 Minutes    Jonathon Resides PA-C [facility] 10/10/2022 8:43 AM

## 2022-11-24 ENCOUNTER — Encounter: Payer: Self-pay | Admitting: Physician Assistant

## 2022-11-24 ENCOUNTER — Ambulatory Visit (INDEPENDENT_AMBULATORY_CARE_PROVIDER_SITE_OTHER): Payer: BLUE CROSS/BLUE SHIELD | Admitting: Physician Assistant

## 2022-11-24 VITALS — HR 111 | Temp 98.9°F

## 2022-11-24 DIAGNOSIS — J039 Acute tonsillitis, unspecified: Secondary | ICD-10-CM

## 2022-11-24 LAB — POCT RAPID STREP A (OFFICE): Rapid Strep A Screen: NEGATIVE

## 2022-11-24 NOTE — Progress Notes (Signed)
[facility] [address] [place], [place] [address] Phone: [phone] Fax: [phone]   Office Visit Note  Patient Name: Teresa Avila  Date of Birth:05/09/2000  Med Rec number [id number]   Sulfa antibiotics and Clindamycin phos-benzoyl perox  Chief Complaint  Patient presents with   Acute Visit   22 year old female with history of strep 10/10/22 (note reviewed) Sore throat started two days ago, yesterday worsened Took Advil  - went to bed at [facility]   Now, feels a little improved   No cough No congestion   Does feel that ears are bothering her  Also has pain between her eyes   Still has her tonsils No fever to her knowledge   Has had mono before in the past - Sophomore year   Still taking Xyzal and Linzess  PCP has lightly discussed removal of tonsils before in the past but no formal ENT discussion regarding removal  Current Medication:  Outpatient Encounter Medications as of 11/24/2022  Medication Sig   LINZESS 145 MCG CAPS capsule TAKE 1 CAPSULE BY MOUTH DAILY BEFORE BREAKFAST.   triamcinolone cream (KENALOG) 0.1 % Apply 1 Application topically 2 (two) times daily. Do not use longer than 2 weeks. (Patient not taking: Reported on 11/24/2022)   No facility-administered encounter medications on file as of 11/24/2022.      Medical History: Past Medical History:  Diagnosis Date   Chronic idiopathic constipation      Vital Signs: Pulse (!) 111   Temp 98.9 F (37.2 C) (Tympanic)   SpO2 99%    ROS negative unless otherwise indicated above.  Physical Exam Vitals reviewed.  HENT:     Right Ear: Ear canal and external ear normal. No laceration, drainage, swelling or tenderness. There is no impacted cerumen. No foreign body. Tympanic membrane is not injected, perforated or erythematous.     Left Ear: Ear canal and external ear normal. No laceration, drainage, swelling or tenderness. There is no impacted cerumen. No foreign body. Tympanic membrane is  not injected, perforated or erythematous.     Nose: Nose normal. No mucosal edema, congestion or rhinorrhea.     Right Nostril: No epistaxis.     Left Nostril: No epistaxis.     Right Turbinates: Not enlarged or swollen.     Left Turbinates: Not enlarged or swollen.     Right Sinus: No maxillary sinus tenderness or frontal sinus tenderness.     Left Sinus: No maxillary sinus tenderness or frontal sinus tenderness.     Mouth/Throat:     Pharynx: Posterior oropharyngeal erythema present. No oropharyngeal exudate.     Tonsils: No tonsillar exudate or tonsillar abscesses. 2+ on the right. 3+ on the left.  Eyes:     Pupils: Pupils are equal, round, and reactive to light.  Cardiovascular:     Rate and Rhythm: Regular rhythm. Tachycardia present.     Heart sounds: Normal heart sounds. No murmur heard.    No friction rub.  Pulmonary:     Effort: Pulmonary effort is normal. No respiratory distress.     Breath sounds: No stridor. No wheezing, rhonchi or rales.  Musculoskeletal:     Cervical back: No tenderness.  Lymphadenopathy:     Cervical: Cervical adenopathy present.  Neurological:     Mental Status: She is alert.  Psychiatric:        Behavior: Behavior normal.     Results for orders placed or performed in visit on 11/24/22 (from the past 24  hour(s))  POCT rapid strep A     Status: Normal   Collection Time: 11/24/22 10:46 AM  Result Value Ref Range   Rapid Strep A Screen Negative Negative     Assessment/Plan:  1. Tonsillitis - POCT rapid strep A - Culture, Group A Strep    Will provide updated plan once culture resulted, if needed.  Until then, continue Ibuprofen. Can add chloraseptic spray, cough drops, salt water gargle.  Send MyChart message if worsening symptoms over the weekend as discussed today - includes both throat pain and the pain between her eyes.  Did discuss mono - has had in the past. If ongoing sx, can retest; however, at this time, will defer.      General Counseling: [name] verbalizes understanding of the findings of todays visit and agrees with plan of treatment. I have discussed any further diagnostic evaluation that may be needed or ordered today. We also reviewed her medications today. she has been encouraged to call the office with any questions or concerns that should arise related to todays visit.   Orders Placed This Encounter  Procedures   Culture, Group A Strep   POCT rapid strep A    No orders of the defined types were placed in this encounter.     Signed, Lennon Alstrom, PA-C 11/24/2022, 10:59 AM

## 2022-11-28 LAB — CULTURE, GROUP A STREP: Strep A Culture: NEGATIVE

## 2022-12-06 ENCOUNTER — Ambulatory Visit: Payer: BLUE CROSS/BLUE SHIELD | Admitting: Adult Health

## 2022-12-18 ENCOUNTER — Telehealth: Payer: Self-pay | Admitting: Urology

## 2022-12-18 ENCOUNTER — Other Ambulatory Visit: Payer: Self-pay | Admitting: Urology

## 2022-12-18 DIAGNOSIS — R3989 Other symptoms and signs involving the genitourinary system: Secondary | ICD-10-CM

## 2022-12-18 NOTE — Telephone Encounter (Signed)
Scheduled patient tomorrow 11/26 with [name]. Called patient, left generic message to please call back with appt information.     Access center- if patient calls back, please send a teams chat to me in order to warm transfer. Thank you

## 2022-12-18 NOTE — Telephone Encounter (Unsigned)
Copied from CRM [id number]. Topic: Access to Care - Speak to Provider/Office Staff  >> Dec 18, 2022  8:53 AM Clovis Mankins G wrote:  The patient is requesting to speak with a nurse. Patient stated that this is regarding her grand father,  Dr. Carlyle Lipa spoke to  Dr. Abran Cantor at home on 12-18-2022. Dr. Abran Cantor stated he is in surgery, but would like Amiree to see [name] today 12-18-2022. Recurrent UTI.     Please call the patient back at [phone] to discuss.

## 2022-12-18 NOTE — Telephone Encounter (Signed)
[name],  Can you see a young patient tomorrow who is in town from college?  Her name is Virginia Bowers, dob 2000/11/08.  She is having urinary pain/possible UTI.  We have been seeing her grandfather Selena Batten for years and he reached out to me.    [place]- please coordinate an appt time with [name] and schedule.  Thank you,    Elijah Birk

## 2022-12-18 NOTE — Telephone Encounter (Signed)
Patient called back (warm transferred). Gave patient her appt details. Patient agreed to the appt.

## 2022-12-19 ENCOUNTER — Other Ambulatory Visit: Payer: Self-pay

## 2022-12-19 ENCOUNTER — Ambulatory Visit: Payer: PRIVATE HEALTH INSURANCE | Attending: Urology | Admitting: Urology

## 2022-12-19 VITALS — Ht 65.5 in | Wt 117.0 lb

## 2022-12-19 DIAGNOSIS — Z87440 Personal history of urinary (tract) infections: Secondary | ICD-10-CM | POA: Insufficient documentation

## 2022-12-19 LAB — POCT URINALYSIS DIPSTICK
Glucose,UA POCT: NORMAL mg/dL
Ketones,UA POCT: NEGATIVE mg/dL
Leuk Esterase,UA POCT: NEGATIVE
Lot #: 73386502
Nitrite,UA POCT: NEGATIVE
PH,UA POCT: 6 (ref 5–8)
Specific gravity,UA POCT: 1.015 (ref 1.002–1.030)

## 2022-12-19 NOTE — Progress Notes (Signed)
[facility] - Urology Clinic    CC: hx of UTI     Subjective   History:  HPI  Sx started wedneysday night. Then Thursday sx were bad. Took AZO, water/cranberry juice. Friday drove home from college and had dysuria. Went out Friday night then Saturday sx were really bad. Continued to take AZO. Used a heating pad and fell asleep. Sunday sx were a little better. Yesterday felt a little better. Now feels back to normal.     Had one UTI a yr for the last 4 yrs.     Has menstrual cycle now. Feels like sx are worse around her menses.     No blood in the urine.     Mom has hx of kidney stones. No FHx of GU malignancies. pGrandfather with kidney stones.     For yrs gets random pain from belly button down. 2 yrs ago the pain was so severe. Went for [place].     For yrs as a baby had to have special testing for reflux to kidney. No flank pain. Had UTIs as a baby.     Hydrates well. Takes miralax daily. Has IBS. Takes Linzess, miralax.   No clear pattern to UTIs with sexual intercourse.      Objective   Examination:  Vitals:    12/19/22 0906   Weight: 53.1 kg (117 lb)   Height: 1.664 m (5' 5.5")        Assessment & Plan   This is a 22 y.o. F who presents for UTI. Has had 4 in the last 4 yrs. Sx came on last Wednesday and are now resolved. No cx or abx done. Hydrated well, used cranberry and feels well today.   Has hx of VUR as a child. SCr is normal and does not have hx of pyelonephritis nor flank pain.   Could always consider a renal US but ultimately would recommend adequate hydration, managing constipation and culturing urine to rule in or out a UTI.   I will cx today's urine to see if low colony count UTI present. No Leuks present making UTI less likely today. Also will give standing orders for her to have at [place].     Meds and Orders Placed this Visit:  1. History of UTI  - POCT urinalysis dipstick  - Aerobic culture (AER, Voided Urine- Recurrent UTI)  - Aerobic culture (AER, Voided Urine- Recurrent UTI); Standing  - URINALYSIS  WITH MICROSCOPIC; Standing    Follow up for end of May for follow up .     Merlinda Frederick, PA 12/19/2022

## 2022-12-20 ENCOUNTER — Telehealth: Payer: Self-pay | Admitting: Urology

## 2022-12-20 DIAGNOSIS — K5909 Other constipation: Secondary | ICD-10-CM | POA: Insufficient documentation

## 2022-12-20 LAB — AEROBIC CULTURE: Aerobic Culture: 0

## 2022-12-20 NOTE — Telephone Encounter (Signed)
-----   Message from [place], [place] sent at 12/20/2022 12:02 PM EST -----  Please let pt know urine culture is negative. No infection

## 2022-12-20 NOTE — Telephone Encounter (Signed)
Called and spoke to patient. Relayed below. Patient states understanding. No further questions at this time. Thanks, Shelbee Apgar

## 2022-12-25 ENCOUNTER — Ambulatory Visit (INDEPENDENT_AMBULATORY_CARE_PROVIDER_SITE_OTHER): Payer: BLUE CROSS/BLUE SHIELD | Admitting: Registered Nurse

## 2022-12-25 ENCOUNTER — Encounter: Payer: Self-pay | Admitting: Registered Nurse

## 2022-12-25 ENCOUNTER — Other Ambulatory Visit: Payer: Self-pay

## 2022-12-25 VITALS — BP 110/74 | HR 63 | Temp 98.1°F | Resp 18 | Ht 65.0 in | Wt 116.0 lb

## 2022-12-25 DIAGNOSIS — Z23 Encounter for immunization: Secondary | ICD-10-CM | POA: Diagnosis not present

## 2022-12-25 DIAGNOSIS — J069 Acute upper respiratory infection, unspecified: Secondary | ICD-10-CM

## 2022-12-25 DIAGNOSIS — J028 Acute pharyngitis due to other specified organisms: Secondary | ICD-10-CM

## 2022-12-25 LAB — POCT RAPID STREP A (OFFICE): Rapid Strep A Screen: NEGATIVE

## 2022-12-25 MED ORDER — ONDANSETRON HCL 4 MG PO TABS: 4.0000 mg | ORAL_TABLET | Freq: Three times a day (TID) | ORAL | 0 refills | Status: AC | PRN

## 2022-12-25 MED ORDER — PENICILLIN V POTASSIUM 500 MG PO TABS: 500.0000 mg | ORAL_TABLET | Freq: Two times a day (BID) | ORAL | 0 refills | Status: AC

## 2022-12-25 NOTE — Patient Instructions (Addendum)
Nausea and Vomiting, Adult Nausea is the feeling that you have an upset stomach or that you are about to vomit. As nausea gets worse, it can lead to vomiting. Vomiting is when stomach contents forcefully come out of your mouth as a result of nausea. Vomiting can make you feel weak and cause you to become dehydrated. Dehydration can make you feel tired and thirsty, cause you to have a dry mouth, and decrease how often you urinate. Older adults and people with other diseases or a weak disease-fighting system (immune system) are at higher risk for dehydration. It is important to treat your nausea and vomiting as told by your health care provider. Follow these instructions at home: Watch your symptoms for any changes. Tell your health care provider about them. Eating and drinking     Take an oral rehydration solution (ORS). This is a drink that is sold at pharmacies and retail stores. Drink clear fluids slowly and in small amounts as you are able. Clear fluids include water, ice chips, low-calorie sports drinks, and fruit juice that has water added (diluted fruit juice). Eat bland, easy-to-digest foods in small amounts as you are able. These foods include bananas, applesauce, rice, lean meats, toast, and crackers. Avoid fluids that contain a lot of sugar or caffeine, such as energy drinks, sports drinks, and soda. Avoid alcohol. Avoid spicy or fatty foods. General instructions Take over-the-counter and prescription medicines only as told by your health care provider. Drink enough fluid to keep your urine pale yellow. Wash your hands often using soap and water for at least 20 seconds. If soap and water are not available, use hand sanitizer. Make sure that everyone in your household washes their hands well and often. Rest at home while you recover. Watch your condition for any changes. Take slow and deep breaths when you feel nauseous. Keep all follow-up visits. This is important. Contact a health  care provider if: Your symptoms get worse. You have new symptoms. You have a fever. You cannot drink fluids without vomiting. Your nausea does not go away after 2 days. You feel light-headed or dizzy. You have a headache. You have muscle cramps. You have a rash. You have pain while urinating. Get help right away if: You have pain in your chest, neck, arm, or jaw. You feel extremely weak or you faint. You have persistent vomiting. You have vomit that is bright red or looks like black coffee grounds. You have bloody or black stools (feces) or stools that look like tar. You have a severe headache, a stiff neck, or both. You have severe pain, cramping, or bloating in your abdomen. You have difficulty breathing, or you are breathing very quickly. Your heart is beating very quickly. Your skin feels cold and clammy. You feel confused. You have signs of dehydration, such as: Dark urine, very little urine, or no urine. Cracked lips. Dry mouth. Sunken eyes. Sleepiness. Weakness. These symptoms may be an emergency. Get help right away. Call 911. Do not wait to see if the symptoms will go away. Do not drive yourself to the hospital. Summary Nausea is the feeling that you have an upset stomach or that you are about to vomit. As nausea gets worse, it can lead to vomiting. Vomiting can make you feel weak and cause you to become dehydrated. Follow instructions from your health care provider about eating and drinking to prevent dehydration. Take over-the-counter and prescription medicines only as told by your health care provider. Contact your health care  provider if your symptoms get worse, or you have new symptoms. Keep all follow-up visits. This is important. This information is not intended to replace advice given to you by your health care provider. Make sure you discuss any questions you have with your health care provider. Document Revised: 07/16/2020 Document Reviewed:  07/16/2020 [employer] Patient Education  2024 [employer] Viral Respiratory Infection A respiratory infection is an illness that affects part of the respiratory system, such as the lungs, nose, or throat. A respiratory infection that is caused by a virus is called a viral respiratory infection. Common types of viral respiratory infections include: A cold. The flu (influenza). A respiratory syncytial virus (RSV) infection. What are the causes? This condition is caused by a virus. The virus may spread through contact with droplets or direct contact with infected people or their mucus or secretions. The virus may spread from person to person (is contagious). What are the signs or symptoms? Symptoms of this condition include: A stuffy or runny nose. A sore throat or cough. Shortness of breath or difficulty breathing. Yellow or green mucus (sputum). Other symptoms may include: A fever. Sweating or chills. Fatigue. Achy muscles. A headache. How is this diagnosed? This condition may be diagnosed based on: Your symptoms. A physical exam. Testing of secretions from the nose or throat. Chest X-ray. How is this treated? This condition may be treated with medicines, such as: Antiviral medicine. This may shorten the length of time a person has symptoms. Expectorants. These make it easier to cough up mucus. Decongestant nasal sprays. Acetaminophen or NSAIDs, such as ibuprofen, to relieve fever and pain. Antibiotic medicines are not prescribed for viral infections.This is because antibiotics are designed to kill bacteria. They do not kill viruses. Follow these instructions at home: Managing pain and congestion Take over-the-counter and prescription medicines only as told by your health care provider. If you have a sore throat, gargle with a mixture of salt and water 3-4 times a day or as needed. To make salt water, completely dissolve -1 tsp (3-6 g) of salt in 1 cup (237 mL) of warm  water. Use nose drops made from salt water to ease congestion and soften raw skin around your nose. Take 2 tsp (10 mL) of honey at bedtime to lessen coughing at night. Do not give honey to children who are younger than 1 year. Drink enough fluid to keep your urine pale yellow. This helps prevent dehydration and helps loosen up mucus. General instructions  Rest as much as possible. Do not drink alcohol. Do not use any products that contain nicotine or tobacco. These products include cigarettes, chewing tobacco, and vaping devices, such as e-cigarettes. If you need help quitting, ask your health care provider. Keep all follow-up visits. This is important. How is this prevented?     Get an annual flu shot. You may get the flu shot in late summer, fall, or winter. Ask your health care provider when you should get your flu shot. Avoid spreading your infection to other people. If you are sick: Wash your hands with soap and water often, especially after you cough or sneeze. Wash for at least 20 seconds. If soap and water are not available, use alcohol-based hand sanitizer. Cover your mouth when you cough. Cover your nose and mouth when you sneeze. Do not share cups or eating utensils. Clean commonly used objects often. Clean commonly touched surfaces. Stay home from work or school as told by your health care provider. Avoid contact with  people who are sick during cold and flu season. This is generally fall and winter. Contact a health care provider if: Your symptoms last for 10 days or longer. Your symptoms get worse over time. You have severe sinus pain in your face or forehead. The glands in your jaw or neck become very swollen. You have shortness of breath. Get help right away if you: Feel pain or pressure in your chest. Have trouble breathing. Faint or feel like you will faint. Have severe and persistent vomiting. Feel confused or disoriented. These symptoms may represent a serious  problem that is an emergency. Do not wait to see if the symptoms will go away. Get medical help right away. Call your local emergency services (911 in the [place] Do not drive yourself to the hospital. Summary A respiratory infection is an illness that affects part of the respiratory system, such as the lungs, nose, or throat. A respiratory infection that is caused by a virus is called a viral respiratory infection. Common types of viral respiratory infections include a cold, influenza, and respiratory syncytial virus (RSV) infection. Symptoms of this condition include a stuffy or runny nose, cough, fatigue, achy muscles, sore throat, and fevers or chills. Antibiotic medicines are not prescribed for viral infections. This is because antibiotics are designed to kill bacteria. They are not effective against viruses. This information is not intended to replace advice given to you by your health care provider. Make sure you discuss any questions you have with your health care provider. Document Revised: 04/15/2020 Document Reviewed: 04/15/2020 Else[employer] Patient Education  2024 [employer] Pharyngitis  Pharyngitis is inflammation of the throat (pharynx). It is a very common cause of sore throat. Pharyngitis can be caused by a bacteria, but it is usually caused by a virus. Most cases of pharyngitis get better on their own without treatment. What are the causes? This condition may be caused by: Infection by viruses (viral). Viral pharyngitis spreads easily from person to person (is contagious) through coughing, sneezing, and sharing of personal items or utensils such as cups, forks, spoons, and toothbrushes. Infection by bacteria (bacterial). Bacterial pharyngitis may be spread by touching the nose or face after coming in contact with the bacteria, or through close contact, such as kissing. Allergies. Allergies can cause buildup of mucus in the throat (post-nasal drip), leading to inflammation and  irritation. Allergies can also cause blocked nasal passages, forcing breathing through the mouth, which dries and irritates the throat. What increases the risk? You are more likely to develop this condition if: You are 23-5 years old. You are exposed to crowded environments such as daycare, school, or dormitory living. You live in a cold climate. You have a weakened disease-fighting (immune) system. What are the signs or symptoms? Symptoms of this condition vary by the cause. Common symptoms of this condition include: Sore throat. Fatigue. Low-grade fever. Stuffy nose (nasal congestion) and cough. Headache. Other symptoms may include: Glands in the neck (lymph nodes) that are swollen. Skin rashes. Plaque-like film on the throat or tonsils. This is often a symptom of bacterial pharyngitis. Vomiting. Red, itchy eyes (conjunctivitis). Loss of appetite. Joint pain and muscle aches. Enlarged tonsils. How is this diagnosed? This condition may be diagnosed based on your medical history and a physical exam. Your health care provider will ask you questions about your illness and your symptoms. A swab of your throat may be done to check for bacteria (rapid strep test). Other lab tests may also be done, depending on  the suspected cause, but these are rare. How is this treated? Many times, treatment is not needed for this condition. Pharyngitis usually gets better in 3-4 days without treatment. Bacterial pharyngitis may be treated with antibiotic medicines. Follow these instructions at home: Medicines Take over-the-counter and prescription medicines only as told by your health care provider. If you were prescribed an antibiotic medicine, take it as told by your health care provider. Do not stop taking the antibiotic even if you start to feel better. Use throat sprays to soothe your throat as told by your health care provider. Children can get pharyngitis. Do not give your child aspirin because  of the association with Reye's syndrome. Managing pain To help with pain, try: Sipping warm liquids, such as broth, herbal tea, or warm water. Eating or drinking cold or frozen liquids, such as frozen ice pops. Gargling with a mixture of salt and water 3-4 times a day or as needed. To make salt water, completely dissolve -1 tsp (3-6 g) of salt in 1 cup (237 mL) of warm water. Sucking on hard candy or throat lozenges. Putting a cool-mist humidifier in your bedroom at night to moisten the air. Sitting in the bathroom with the door closed for 5-10 minutes while you run hot water in the shower.  General instructions  Do not use any products that contain nicotine or tobacco. These products include cigarettes, chewing tobacco, and vaping devices, such as e-cigarettes. If you need help quitting, ask your health care provider. Rest as told by your health care provider. Drink enough fluid to keep your urine pale yellow. How is this prevented? To help prevent becoming infected or spreading infection: Wash your hands often with soap and water for at least 20 seconds. If soap and water are not available, use hand sanitizer. Do not touch your eyes, nose, or mouth with unwashed hands, and wash hands after touching these areas. Do not share cups or eating utensils. Avoid close contact with people who are sick. Contact a health care provider if: You have large, tender lumps in your neck. You have a rash. You cough up green, yellow-brown, or bloody mucus. Get help right away if: Your neck becomes stiff. You drool or are unable to swallow liquids. You cannot drink or take medicines without vomiting. You have severe pain that does not go away, even after you take medicine. You have trouble breathing, and it is not caused by a stuffy nose. You have new pain and swelling in your joints such as the knees, ankles, wrists, or elbows. These symptoms may represent a serious problem that is an emergency. Do  not wait to see if the symptoms will go away. Get medical help right away. Call your local emergency services (911 in the [place] Do not drive yourself to the hospital. Summary Pharyngitis is redness, pain, and swelling (inflammation) of the throat (pharynx). While pharyngitis can be caused by a bacteria, the most common causes are viral. Most cases of pharyngitis get better on their own without treatment. Bacterial pharyngitis is treated with antibiotic medicines. This information is not intended to replace advice given to you by your health care provider. Make sure you discuss any questions you have with your health care provider. Document Revised: 04/07/2020 Document Reviewed: 04/07/2020 [employer] Patient Education  2024 [employer].

## 2022-12-25 NOTE — Progress Notes (Signed)
Subjective:    Patient ID: Teresa Avila, female    DOB: [birth year], 22 y.o.   MRN: [id number]  Teresa Avila, a patient with a history of recurrent tonsillitis, presented with a sore throat that began on a recent Friday morning. The patient reported waking up feeling unwell, with symptoms including headaches and swollen tonsils. The patient has a history of tonsillar issues and is considering a tonsillectomy. The patient also reported facial swelling and pain, as well as nausea, which they attributed to postnasal drip. The patient experienced a fever the previous night, characterized by feeling cold while their body was hot, and excessive sweating throughout the night. The patient took Dayquil, which seemed to alleviate some symptoms. The patient's symptoms also included fluctuating body temperature, continued tonsillar swelling, throat pain, facial inflammation, and difficulty breathing, which affected their sleep. The patient also reported feeling generally unwell and run down.  The patient has been taking Linzess daily for an unspecified condition and also takes two servings of Miralax daily for constipation. Despite this, the patient reported still having difficulty with bowel movements. The patient also reported drinking water regularly to maintain light-colored urine and manage constipation.  The patient had a recent history of tonsillitis and strep throat, with the most recent episode of tonsillitis occurring in November and strep throat in September. The patient reported typically experiencing these conditions once per season and also reported getting the flu once or twice a year. The patient had not yet received their flu shot for the current year.  The patient reported not feeling hungry and had only consumed two pancakes and a full water bottle plus a little more on the day of the consultation. The patient had urinated approximately 20 minutes prior to the consultation. The patient reported  no current ear issues.     Review of Systems  Constitutional:  Positive for chills, diaphoresis and fatigue. Negative for fever.  HENT:  Positive for congestion, facial swelling, postnasal drip, rhinorrhea, sinus pressure and sore throat. Negative for dental problem, drooling, ear discharge, ear pain, nosebleeds, tinnitus, trouble swallowing and voice change.   Eyes:  Negative for photophobia and visual disturbance.  Respiratory:  Positive for cough. Negative for choking, chest tightness, shortness of breath, wheezing and stridor.   Cardiovascular:  Negative for chest pain, palpitations and leg swelling.  Gastrointestinal:  Positive for constipation and nausea. Negative for abdominal distention, abdominal pain, blood in stool, diarrhea and vomiting.  Genitourinary:  Negative for difficulty urinating.  Musculoskeletal:  Negative for neck pain and neck stiffness.  Skin:  Negative for rash.  Neurological:  Positive for headaches. Negative for dizziness, tremors, seizures, syncope, speech difficulty, weakness, light-headedness and numbness.  Hematological:  Positive for adenopathy. Does not bruise/bleed easily.  Psychiatric/Behavioral:  Negative for agitation, confusion and sleep disturbance.       Objective:   Physical Exam Vitals and nursing note reviewed.  Constitutional:      General: She is not in acute distress.    Appearance: She is well-developed and normal weight. She is ill-appearing. She is not toxic-appearing or diaphoretic.  HENT:     Head: Normocephalic and atraumatic.     Jaw: There is normal jaw occlusion. No trismus, tenderness, swelling, pain on movement or malocclusion.     Salivary Glands: Right salivary gland is not diffusely enlarged or tender. Left salivary gland is not diffusely enlarged or tender.     Right Ear: Hearing, ear canal and external ear normal. No decreased hearing noted. No  laceration, drainage, swelling or tenderness. A middle ear effusion is present.  There is no impacted cerumen. No foreign body. No mastoid tenderness. No PE tube. No hemotympanum. Tympanic membrane is not injected, scarred, perforated, erythematous, retracted or bulging.     Left Ear: Hearing, ear canal and external ear normal. No decreased hearing noted. No laceration, drainage, swelling or tenderness. A middle ear effusion is present. There is no impacted cerumen. No foreign body. No mastoid tenderness. No PE tube. No hemotympanum. Tympanic membrane is not injected, scarred, perforated, erythematous, retracted or bulging.     Ears:     Comments: Bilateral TMs intact air fluid level clear no debris noted in auditory canals    Nose: Nasal tenderness, mucosal edema, congestion and rhinorrhea present. Rhinorrhea is clear.     Right Nostril: No epistaxis.     Left Nostril: No epistaxis.     Right Turbinates: Enlarged and swollen. Not pale.     Left Turbinates: Enlarged and swollen. Not pale.     Right Sinus: Maxillary sinus tenderness and frontal sinus tenderness present.     Left Sinus: Maxillary sinus tenderness and frontal sinus tenderness present.     Comments: Mildly TTP bridge of nose and frontal/maxillary bilaterally; tonsils 2+/4 enlarged mild erythema no exudate; bilateral allergic shiners; clear discharge nasal turbinates    Mouth/Throat:     Mouth: Mucous membranes are normal. Mucous membranes are moist. No oral lesions.     Dentition: No gum lesions.     Tongue: No lesions. Tongue does not deviate from midline.     Palate: No mass and lesions.     Pharynx: Uvula midline. Pharyngeal swelling, posterior oropharyngeal edema, posterior oropharyngeal erythema and postnasal drip present. No oropharyngeal exudate or uvula swelling.     Tonsils: No tonsillar exudate or tonsillar abscesses. 2+ on the right. 2+ on the left.     Comments: Cobblestoning posterior pharynx; nasal sniffing observed in exam room Eyes:     General: Lids are normal. Lids are everted, no foreign  bodies appreciated. Gaze aligned appropriately. Allergic shiner present.     Extraocular Movements: EOM normal.     Right eye: Normal extraocular motion.     Conjunctiva/sclera: Conjunctivae normal.     Pupils: Pupils are equal, round, and reactive to light.  Neck:     Trachea: Trachea and phonation normal. No abnormal tracheal secretions.     Comments: Mild tenderness deep cervical palpation mid neck Cardiovascular:     Rate and Rhythm: Normal rate and regular rhythm.     Pulses: Normal pulses.          Radial pulses are 2+ on the right side and 2+ on the left side.     Heart sounds: Normal heart sounds, S1 normal and S2 normal.  Pulmonary:     Effort: Pulmonary effort is normal. No respiratory distress.     Breath sounds: Normal breath sounds and air entry. No stridor or transmitted upper airway sounds. No decreased breath sounds, wheezing, rhonchi or rales.     Comments: Spoke full sentences without difficulty; no cough observed in exam room Abdominal:     General: Bowel sounds are decreased. There is no distension.     Palpations: Abdomen is soft. There is no shifting dullness, fluid wave, hepatomegaly, splenomegaly or mass.     Tenderness: There is no abdominal tenderness. There is no right CVA tenderness, left CVA tenderness, guarding or rebound. Negative signs include Murphy's sign.     Hernia:  No hernia is present.     Comments: Hypoactive bowel sounds x 4 quads; dull to percussion x 4 quads  Musculoskeletal:        General: Normal range of motion.     Right hand: Normal strength. Normal capillary refill.     Left hand: Normal strength. Normal capillary refill.     Cervical back: Normal range of motion and neck supple. No swelling, edema, deformity, erythema, signs of trauma, lacerations, rigidity, spasms, torticollis, tenderness or crepitus. No pain with movement or muscular tenderness. Normal range of motion.     Thoracic back: No swelling, edema, deformity, signs of trauma,  lacerations, spasms or tenderness. Normal range of motion.     Right lower leg: No edema.     Left lower leg: No edema.  Lymphadenopathy:     Head:     Right side of head: No submental, submandibular, tonsillar, preauricular, posterior auricular or occipital adenopathy.     Left side of head: No submental, submandibular, tonsillar, preauricular, posterior auricular or occipital adenopathy.     Cervical: Cervical adenopathy present.     Right cervical: Deep cervical adenopathy present. No superficial or posterior cervical adenopathy.    Left cervical: Deep cervical adenopathy present. No superficial or posterior cervical adenopathy.  Skin:    General: Skin is warm, dry and intact.     Capillary Refill: Capillary refill takes less than 2 seconds.     Coloration: Skin is not ashen, cyanotic, jaundiced, mottled, pale or sallow.     Findings: Erythema and rash present. No abrasion, abscess, acne, bruising, burn, ecchymosis, signs of injury, laceration, lesion, petechiae or wound. Rash is macular and scaling. Rash is not crusting, nodular, papular, purpuric, pustular, urticarial or vesicular.     Nails: There is no clubbing.     Comments: Bilateral cheeks faint erythema/dry skin  Neurological:     General: No focal deficit present.     Mental Status: She is alert and oriented to person, place, and time.     GCS: GCS eye subscore is 4. GCS verbal subscore is 5. GCS motor subscore is 6.     Cranial Nerves: Cranial nerves 2-12 are intact. No cranial nerve deficit, dysarthria or facial asymmetry.     Sensory: Sensation is intact.     Motor: Motor function is intact. No weakness, tremor, atrophy, abnormal muscle tone or seizure activity.     Coordination: Coordination is intact.     Comments: On/off exam table without difficulty; gait sure and steady in clinic; bilateral hand grasp equal 5/5  Psychiatric:        Attention and Perception: Attention and perception normal.        Mood and Affect: Mood  and affect, mood and affect normal.        Speech: Speech normal.        Behavior: Behavior normal. Behavior is cooperative.        Thought Content: Thought content normal.        Cognition and Memory: Cognition, memory and cognition and memory normal.        Judgment: Judgment normal.     Patient notified rapid strep test negative verbally in clinic and stated understanding.    Assessment & Plan:  A-viral URI, acute pharyngitis, need for flu and covid vaccination  P- Upper Respiratory Infection Symptoms of sore throat, swollen tonsils, facial swelling, postnasal drip, headache, and fever. History of recurrent tonsillitis and strep throat. No white spots or severe redness observed  on tonsils. Possible viral etiology, with community prevalence of RSV and a stomach virus.  Discussed with patient most likely viral URI and post nasal drip irritating throat and tonsils. -Perform POCT rapid strep test. -Prescribe Penicillin to be taken only if white spots develop on tonsils or if sinus pain worsens with cloudy discharge. -Advise on symptomatic treatment with Flonase or nasal saline, Dayquil/Nyquil, and Sudafed. Patient may use normal saline nasal spray 2 sprays each nostril q2h wa as needed. flonase 1 spray each nostril BID  OTC antihistamine of choice claritin/zyrtec 10mg  po daily.  Avoid triggers if possible.  Shower prior to bedtime if exposed to triggers.  -Recommend salt water gargles, hydration, and a bland diet. Discussed viral and strep circulating in community will test for strep today POCT ordered. Usually no specific medical treatment is needed if a virus is causing the sore throat. The throat most often gets better on its own within 5 to 7 days. Antibiotic medicine does not cure viral pharyngitis.  -For acute pharyngitis caused by bacteria, your healthcare provider will prescribe an antibiotic.  -May continue OTC dayquil/nyquil.  Read active ingredients to ensure not overdosing on  pain medications/timing.  Motrin/Ibuprofen 800mg  by mouth every 8 hours with food and/or Tylenol 1000mg  by mouth every 6 hours as needed for pain or fever OTC -cloraseptic lozenge OTC per manufacturer instructions prn sore throat ER/call 911 if drooling, unable to swallow, trouble breathing discussed tonsillar abscess symptoms/hot potato voice. -Do not smoke.  -Avoid secondhand smoke and other air pollutants.  -Use a cool mist humidifier to add moisture to the air.  -Get plenty of rest; sleep 7-8 hours per night -You may want to rest your throat by talking less and eating a diet that is mostly liquid or soft for a day or two.  -Nonprescription throat lozenges and mouthwashes should help relieve the soreness.  -Gargling with warm saltwater and drinking warm liquids may help. (You can make a saltwater solution by adding 1/4 teaspoon of salt to 8 ounces, or 240 mL, of warm water.)  -Avoid oral intimate contact until symptoms resolved -Wash dishes/silverware/glasses in hot water or diswasher -Do not share glasses/silverware/dishes during meals that touch your lips/mouth -Usually no specific medical treatment is needed if a virus is causing the sore throat. The throat most often gets better on its own within 5 to 7 days. Antibiotic medicine does not cure viral pharyngitis. DDx mononucleosis, strep throat, hand foot and mouth, post nasal drip irritation pharynx/throat Start Pen V 500mg  po BID x 10 days #20 RF0 electronic rx sent to her pharmacy of choice if white spots on tonsils, fever, worsening enlarged lymph nodes. -Exitcare handout on pharyngitis and viral URI FOLLOW UP with clinic provider if no improvements in the next 7-10 days. Patient verbalized understanding of instructions and agreed with plan of care.  P2: Hand washing and diet.     Nausea Possible association with postnasal drip or community circulating stomach virus.  Constipation can worsen nausea ensure taking current regimen of  Linzess and Miralax  -Advised on adequate hydration.  Follow up with PCM prn chronic constipation -Prescribed oral nausea medication to be taken as needed. Zofran 4mg  po TID prn n/v #9 RF0 sent electronically to her pharmacy of choice.  I have recommended clear fluids and bland diet.  Avoid dairy/spicy, fried and large portions of meat while having nausea.  If vomiting hold po intake x 1 hour.  Then sips clear fluids like broths, ginger ale, power ade,  gatorade, pedialyte may advance to soft/bland if no vomiting x 24 hours and appetite returned otherwise hydration main focus. Return to the clinic if symptoms persist or worsen; I have alerted the patient to call if high fever, dehydration, marked weakness, fainting, increased abdominal pain, blood in stool or vomit (red or black).   Exitcare handout on nausea/vomiting.   Patient verbalized agreement and understanding of treatment plan and had no further questions at this time.    Influenza Vaccination Not yet received for the current year.  Patient refused administration today preferred to scheduled covid and flu together with [facility] or pharmacy so she can get both on same day.  Patient notified if she changes her mind she can call front desk to schedule appt for flu vaccinate at [facility].  Patient verbalized understanding information and had no further questions at that time.

## 2023-02-04 DIAGNOSIS — K581 Irritable bowel syndrome with constipation: Secondary | ICD-10-CM

## 2023-02-04 MED ORDER — LINACLOTIDE 145 MCG PO CAPS: 145.0000 ug | ORAL_CAPSULE | Freq: Every day | ORAL | 0 refills | Status: DC

## 2023-04-04 ENCOUNTER — Other Ambulatory Visit: Payer: Self-pay | Admitting: Medical

## 2023-04-04 DIAGNOSIS — K581 Irritable bowel syndrome with constipation: Secondary | ICD-10-CM

## 2023-04-04 MED ORDER — LINACLOTIDE 145 MCG PO CAPS: 145.0000 ug | ORAL_CAPSULE | Freq: Every day | ORAL | 1 refills | Status: AC

## 2023-06-19 ENCOUNTER — Telehealth: Payer: Self-pay | Admitting: Urology

## 2023-06-19 ENCOUNTER — Other Ambulatory Visit: Payer: Self-pay | Admitting: Urology

## 2023-06-19 DIAGNOSIS — R3989 Other symptoms and signs involving the genitourinary system: Secondary | ICD-10-CM

## 2023-06-19 NOTE — Telephone Encounter (Unsigned)
 Copied from CRM [id number]. Topic: Appointments - Cancel Appointment  >> Jun 19, 2023  4:12 PM Denette Finner wrote:  Virginia Bowers , patient,  is calling to cancel the patients  FUV appointment which is currently scheduled on 5/28//25 with [facility].    Reason for the cancellation: does not need    Has the appointment been cancelled? yes    Has the appointment been rescheduled? no    Date of new appointment?      Does the patient need a call back to reschedule?no      Patient can be reached at Telephone Information:  Mobile          [phone]

## 2023-06-20 ENCOUNTER — Ambulatory Visit: Payer: PRIVATE HEALTH INSURANCE | Admitting: Urology

## 7878-04-24 DEATH — deceased
